# Patient Record
Sex: Male | Born: 1965 | Race: White | Hispanic: No | Marital: Married | Smoking: Never smoker
Health system: Southern US, Community
[De-identification: ages and names within clinical notes are randomized; demographics above are authoritative.]

---

## 1988-07-19 HISTORY — PX: REPAIR / SUTURE TESTICULAR INJURY: SUR1151

## 1998-07-19 HISTORY — PX: NASAL SEPTUM SURGERY: SHX37

## 2007-11-06 ENCOUNTER — Encounter: Admission: RE | Admit: 2007-11-06 | Discharge: 2007-11-06 | Payer: Self-pay | Admitting: Otolaryngology

## 2009-11-29 IMAGING — CT CT PARANASAL SINUSES LIMITED
1 series · 16 of 30 positions shown, 20 images · non-contrast
Comparison: None

CLINICAL DATA: Chronic sinusitis and pressure.

CT PARANASAL SINUSES WITHOUT CONTRAST
TECHNIQUE: Multidetector CT through the paranasal sinuses was
performed using the standard protocol without intravenous contrast.

[Series 2: limited sinus prone · axial · 0.33mm/px · z∈[-12,+99]mm · 16 of 32 slices shown, 20 images]
[im 2/32  brain]
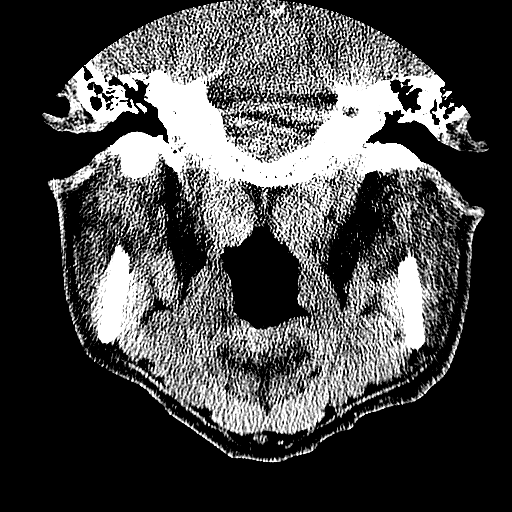
[im 2/32  bone]
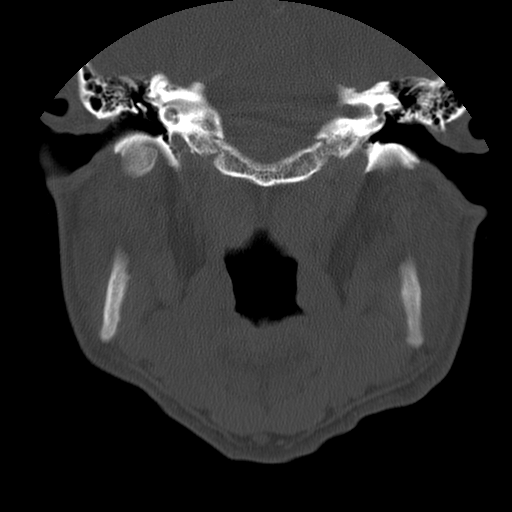
[im 4/32  bone]
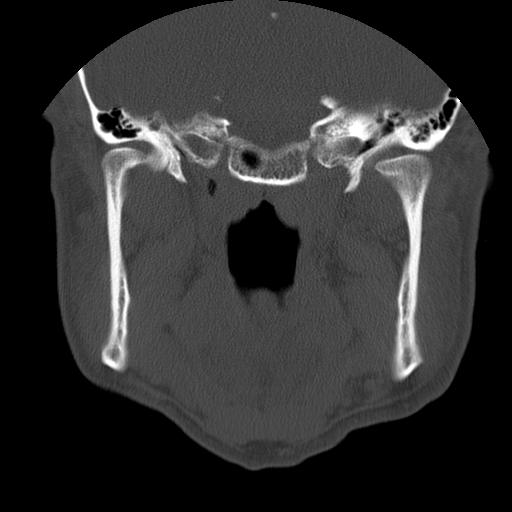
[im 6/32  bone]
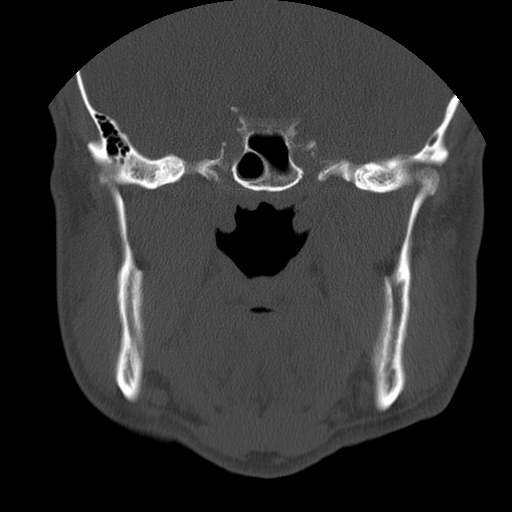
[im 8/32  bone]
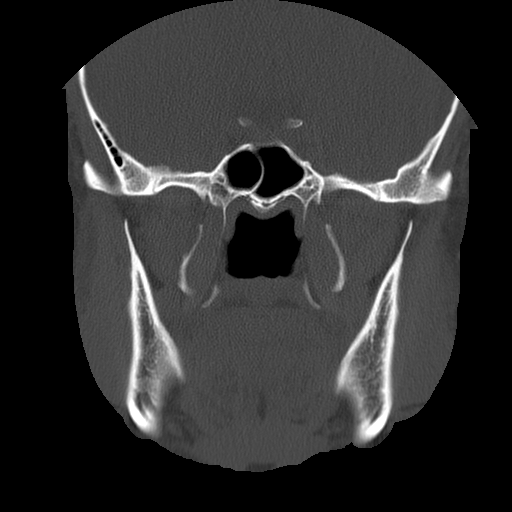
[im 9/32  brain]
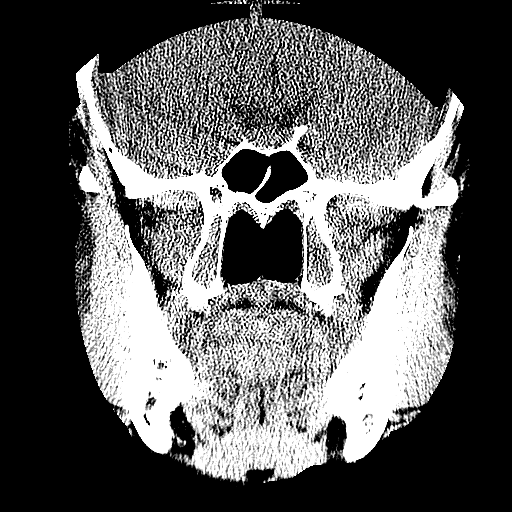
[im 9/32  bone]
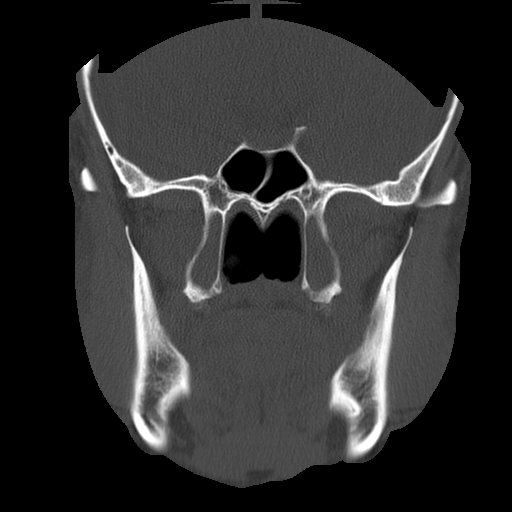
[im 11/32  bone]
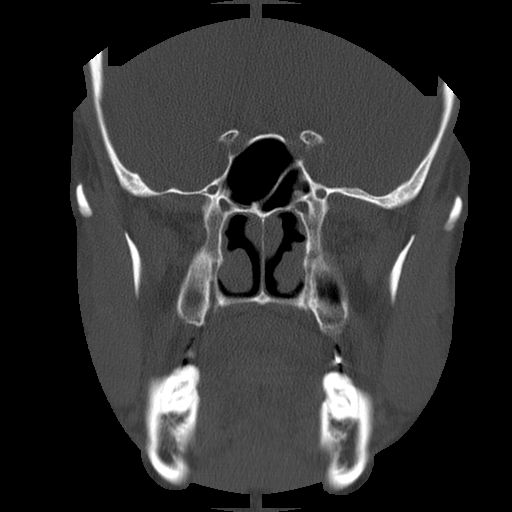
[im 13/32  bone]
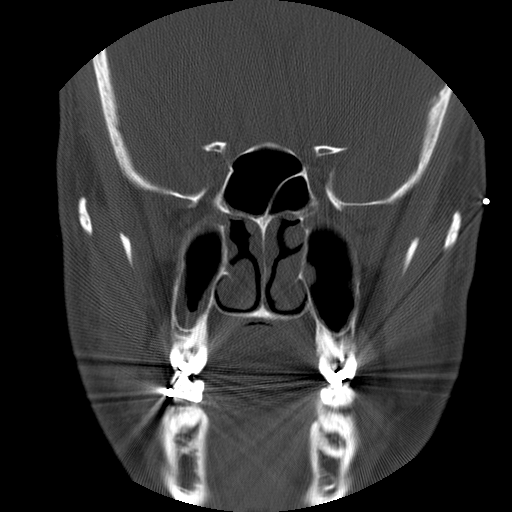
[im 15/32  bone]
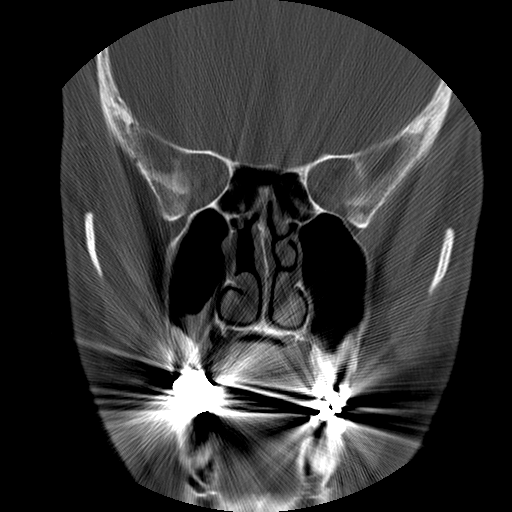
[im 17/32  brain]
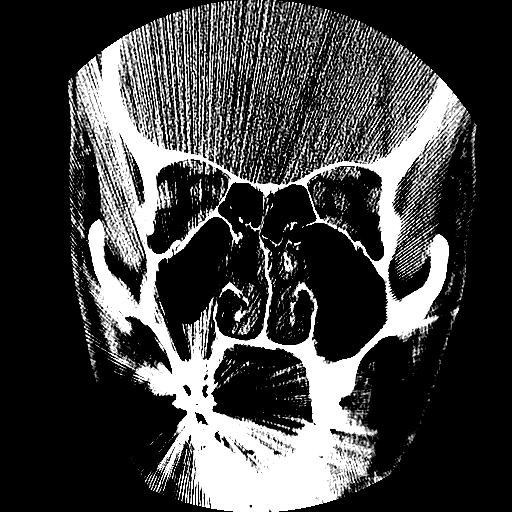
[im 17/32  bone]
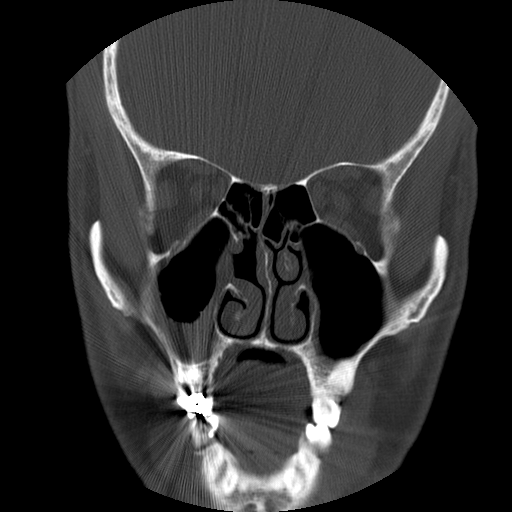
[im 19/32  bone]
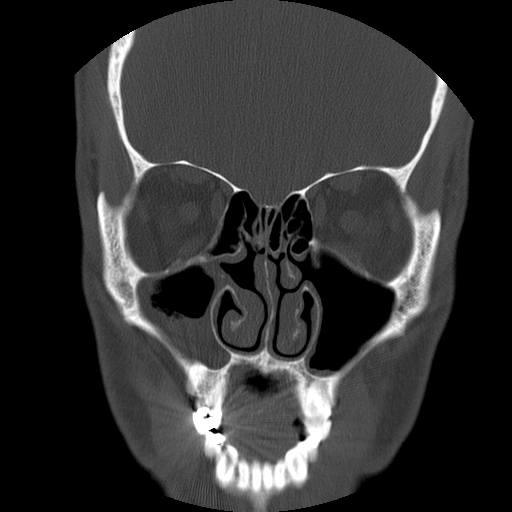
[im 21/32  bone]
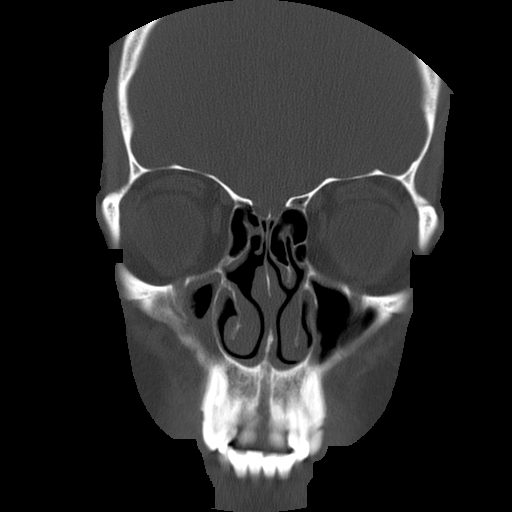
[im 23/32  bone]
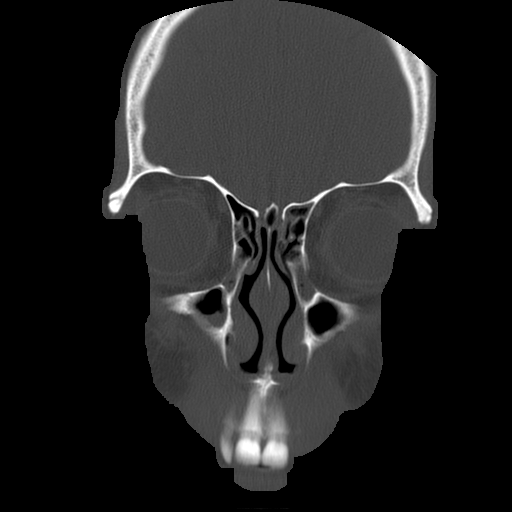
[im 24/32  brain]
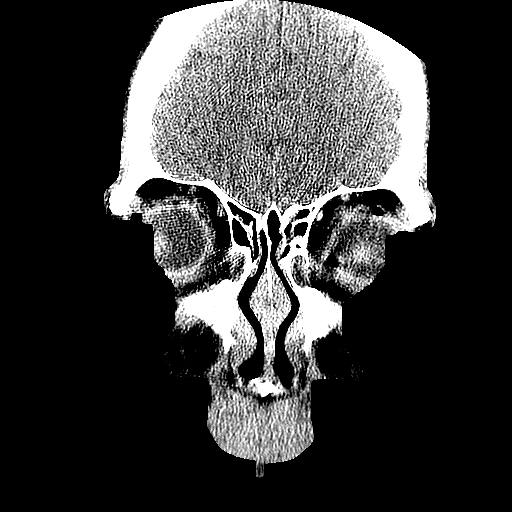
[im 24/32  bone]
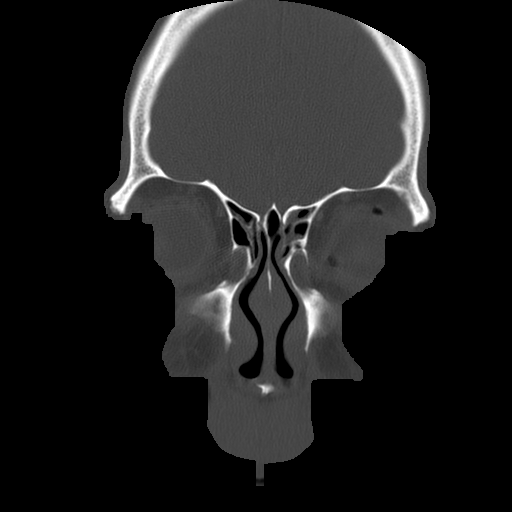
[im 26/32  bone]
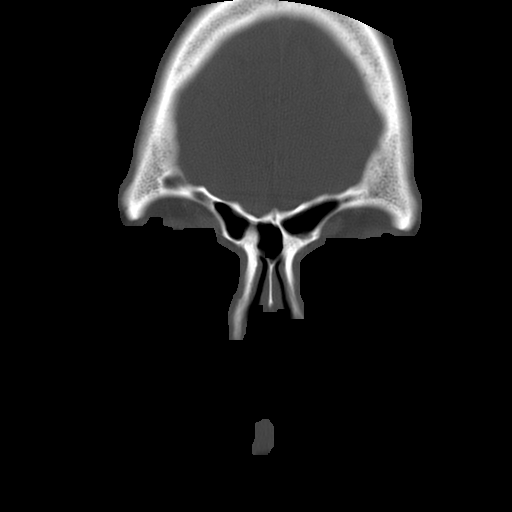
[im 28/32  bone]
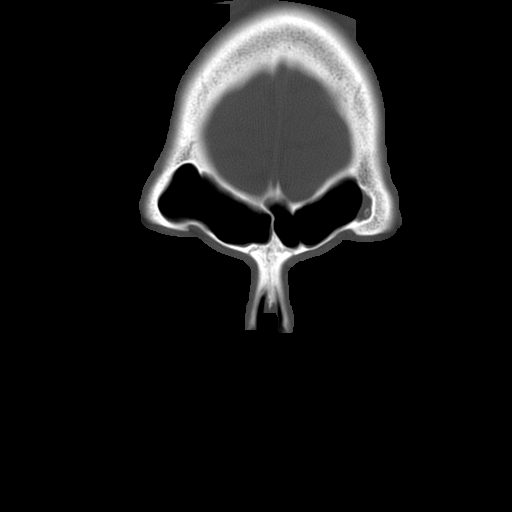
[im 30/32  bone]
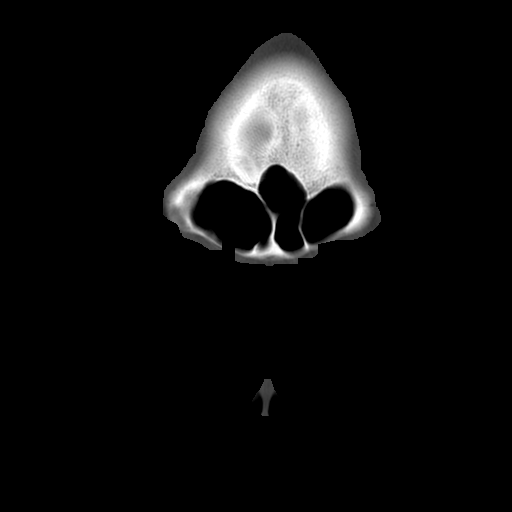

[16 of 30 positions shown; findings below may reference images not displayed]

FINDINGS: Mucosal thickening is seen in all paranasal sinuses and
is worst in the left maxillary sinus.  Retention cyst or polyp is
seen in the right sphenoid sinus.  Mild rightward nasal septum
deviation.  No definite air-fluid levels.  Visualized mastoid air
cells are clear.  Visualized intracranial contents show no acute
findings.
IMPRESSION: 1.  Paranasal sinus inflammation, as above.

## 2022-04-20 ENCOUNTER — Ambulatory Visit: Payer: Self-pay | Admitting: Surgery

## 2022-04-20 DIAGNOSIS — K439 Ventral hernia without obstruction or gangrene: Secondary | ICD-10-CM | POA: Diagnosis present

## 2022-04-20 DIAGNOSIS — E669 Obesity, unspecified: Secondary | ICD-10-CM | POA: Insufficient documentation

## 2022-05-03 NOTE — Patient Instructions (Addendum)
SURGICAL WAITING ROOM VISITATION Patients having surgery or a procedure may have no more than 2 support people in the waiting area - these visitors may rotate in the visitor waiting room.   Children under the age of 71 must have an adult with them who is not the patient. If the patient needs to stay at the hospital during part of their recovery, the visitor guidelines for inpatient rooms apply.  PRE-OP VISITATION  Pre-op nurse will coordinate an appropriate time for 1 support person to accompany the patient in pre-op.  This support person may not rotate.  This visitor will be contacted when the time is appropriate for the visitor to come back in the pre-op area.  Please refer to the [facility] website for the visitor guidelines for Inpatients (after your surgery is over and you are in a regular room).  You are not required to quarantine at this time prior to your surgery. However, you must do this: Hand Hygiene often Do NOT share personal items Notify your provider if you are in close contact with someone who has COVID or you develop fever 100.4 or greater, new onset of sneezing, cough, sore throat, shortness of breath or body aches.   If you received a COVID test during your pre-op visit  it is requested that you wear a mask when out in public, stay away from anyone that may not be feeling well and notify your surgeon if you develop symptoms. If you test positive for Covid or have been in contact with anyone that has tested positive in the last 10 days please notify you surgeon.       Your procedure is scheduled on:  THursday  May 13, 2022  Report to [facility] Main Entrance.  Report to admitting at: 05:15    AM  +++++Call this number if you have any questions or problems the morning of surgery [phone]  Do not eat food :After Midnight the night prior to your surgery/procedure.  After Midnight you may have the following liquids until  04:30 AM DAY OF SURGERY  Clear  Liquid Diet Water Black Coffee (sugar ok, NO MILK/CREAM OR CREAMERS)  Tea (sugar ok, NO MILK/CREAM OR CREAMERS) regular and decaf                             Plain Jell-O  with no fruit (NO RED)                                           Fruit ices (not with fruit pulp, NO RED)                                     Popsicles (NO RED)                                                                  Juice: apple, WHITE grape, WHITE cranberry Sports drinks like Gatorade or Powerade (NO RED)         The evening prior to your surgery,  Drink two (2)  bottles of the Ensure Pre-Surgery Clear      The day of surgery:  Drink ONE (1) Pre-Surgery Clear Ensure at 04:30  AM the morning of surgery. Drink in one sitting. Do not sip.  This drink was given to you during your hospital pre-op appointment visit. Nothing else to drink after completing the Pre-Surgery Clear Ensure,  No candy, chewing gum or throat lozenges.    FOLLOW BOWEL PREP AND ANY ADDITIONAL PRE OP INSTRUCTIONS YOU RECEIVED FROM YOUR SURGEON'S OFFICE!!!   Oral Hygiene is also important to reduce your risk of infection.        Remember - BRUSH YOUR TEETH THE MORNING OF SURGERY WITH YOUR REGULAR TOOTHPASTE   Take ONLY these medicines the morning of surgery with A SIP OF WATER: No Medications   You may not have any metal on your body including  jewelry, and body piercing  Do not wear  lotions, powders,  cologne, or deodorant  Men may shave face and neck.  Contacts, Hearing Aids, dentures or bridgework may not be worn into surgery.   You may bring a small overnight bag with you on the day of surgery, only pack items that are not valuable .[facility] IS NOT RESPONSIBLE   FOR VALUABLES THAT ARE LOST OR STOLEN.   DO NOT BRING YOUR HOME MEDICATIONS TO THE HOSPITAL. PHARMACY WILL DISPENSE MEDICATIONS LISTED ON YOUR MEDICATION LIST TO YOU DURING YOUR ADMISSION IN THE HOSPITAL!   Patients discharged on the day of surgery will not be  allowed to drive home.  Someone NEEDS to stay with you for the first 24 hours after anesthesia.  Special Instructions: Bring a copy of your healthcare power of attorney and living will documents the day of surgery, if you wish to have them scanned into your [facility]- EPIC  Please read over the following fact sheets you were given: IF YOU HAVE QUESTIONS ABOUT YOUR PRE-OP INSTRUCTIONS, PLEASE CALL [phone]  (KAY)   [place] - Preparing for Surgery Before surgery, you can play an important role.  Because skin is not sterile, your skin needs to be as free of germs as possible.  You can reduce the number of germs on your skin by washing with CHG (chlorahexidine gluconate) soap before surgery.  CHG is an antiseptic cleaner which kills germs and bonds with the skin to continue killing germs even after washing. Please DO NOT use if you have an allergy to CHG or antibacterial soaps.  If your skin becomes reddened/irritated stop using the CHG and inform your nurse when you arrive at Short Stay. Do not shave (including legs and underarms) for at least 48 hours prior to the first CHG shower.  You may shave your face/neck.  Please follow these instructions carefully:  1.  Shower with CHG Soap the night before surgery and the  morning of surgery.  2.  If you choose to wash your hair, wash your hair first as usual with your normal  shampoo.  3.  After you shampoo, rinse your hair and body thoroughly to remove the shampoo.                             4.  Use CHG as you would any other liquid soap.  You can apply chg directly to the skin and wash.  Gently with a scrungie or clean washcloth.  5.  Apply the CHG Soap to your  body ONLY FROM THE NECK DOWN.   Do not use on face/ open                           Wound or open sores. Avoid contact with eyes, ears mouth and genitals (private parts).                       Wash face,  Genitals (private parts) with your normal soap.             6.   Wash thoroughly, paying special attention to the area where your  surgery  will be performed.  7.  Thoroughly rinse your body with warm water from the neck down.  8.  DO NOT shower/wash with your normal soap after using and rinsing off the CHG Soap.            9.  Pat yourself dry with a clean towel.            10.  Wear clean pajamas.            11.  Place clean sheets on your bed the night of your first shower and do not  sleep with pets.  ON THE DAY OF SURGERY : Do not apply any lotions/deodorants the morning of surgery.  Please wear clean clothes to the hospital/surgery center.    FAILURE TO FOLLOW THESE INSTRUCTIONS MAY RESULT IN THE CANCELLATION OF YOUR SURGERY  PATIENT SIGNATURE_________________________________  NURSE SIGNATURE__________________________________  ________________________________________________________________________

## 2022-05-03 NOTE — Progress Notes (Signed)
COVID Vaccine received:  [x]  No []  Yes Date of any COVID positive Test in last 90 days: None  PCP - Cristal Ford, MD   [address], [place] [address]   [phone] (Work)   [phone] (Fax)    Cardiologist - none  Chest x-ray - n/a EKG -  n/a Stress Test -n/a ECHO - n/a Cardiac Cath - n/a  Pacemaker/ICD device     [x]  N/A Spinal Cord Stimulator:[x]  No []  Yes      (Remind patient to bring remote DOS) Other Implants:   Bowel Prep - 3 Ensures (no Bowel Prep ordered)  History of Sleep Apnea? [x]  No []  Yes   Sleep Study Date:   CPAP used?- [x]  No []  Yes  (Instruct to bring their mask & Tubing)  Does the patient monitor blood sugar? []  No []  Yes  [x]  N/A  Blood Thinner Instructions:  None Aspirin Instructions: None Last Dose:  ERAS Protocol Ordered: []  No  [x]  Yes PRE-SURGERY [x]  ENSURE  x3  Comments:   Activity level: Patient can climb a flight of stairs without difficulty; [x]  No CP  [x]  No SOB,   Anesthesia review: No pertinent medical hx, Patient is on no medications prior to surgery.  Patient denies shortness of breath, fever, cough and chest pain at PAT appointment.  Patient verbalized understanding and agreement to the Pre-Surgical Instructions that were given to them at this PAT appointment. Patient was also educated of the need to review these PAT instructions again prior to his/her surgery.I reviewed the appropriate phone numbers to call if they have any and questions or concerns.

## 2022-05-05 ENCOUNTER — Other Ambulatory Visit: Payer: Self-pay

## 2022-05-05 ENCOUNTER — Encounter (HOSPITAL_COMMUNITY)
Admission: RE | Admit: 2022-05-05 | Discharge: 2022-05-05 | Disposition: A | Discharge status: Home / Self Care | Payer: Self-pay | Source: Ambulatory Visit | Attending: Surgery | Admitting: Surgery

## 2022-05-05 ENCOUNTER — Encounter (HOSPITAL_COMMUNITY): Payer: Self-pay

## 2022-05-05 VITALS — BP 150/90 | HR 78 | Temp 98.1°F | Resp 14 | Ht 75.0 in | Wt 254.0 lb

## 2022-05-05 DIAGNOSIS — Z01818 Encounter for other preprocedural examination: Secondary | ICD-10-CM | POA: Insufficient documentation

## 2022-05-05 LAB — CBC
HCT: 43.8 % (ref 39.0–52.0)
Hemoglobin: 14.8 g/dL (ref 13.0–17.0)
MCH: 29.3 pg (ref 26.0–34.0)
MCHC: 33.8 g/dL (ref 30.0–36.0)
MCV: 86.7 fL (ref 80.0–100.0)
Platelets: 224 10*3/uL (ref 150–400)
RBC: 5.05 MIL/uL (ref 4.22–5.81)
RDW: 13.3 % (ref 11.5–15.5)
WBC: 7.9 10*3/uL (ref 4.0–10.5)
nRBC: 0 % (ref 0.0–0.2)

## 2022-05-12 NOTE — Anesthesia Preprocedure Evaluation (Addendum)
Anesthesia Evaluation  Patient identified by MRN, date of birth, ID band Patient awake    Reviewed: Allergy & Precautions, NPO status , Patient's Chart, lab work & pertinent test results  History of Anesthesia Complications Negative for: history of anesthetic complications  Airway Mallampati: II  TM Distance: >3 FB Neck ROM: Full    Dental no notable dental hx.    Pulmonary neg pulmonary ROS,    Pulmonary exam normal        Cardiovascular negative cardio ROS Normal cardiovascular exam     Neuro/Psych negative neurological ROS  negative psych ROS   GI/Hepatic Neg liver ROS, PERIUMBILICAL ABDOMINAL WALL HERNIAE   Endo/Other  BMI 32  Renal/GU negative Renal ROS  negative genitourinary   Musculoskeletal negative musculoskeletal ROS (+)   Abdominal   Peds  Hematology negative hematology ROS (+)   Anesthesia Other Findings Day of surgery medications reviewed with patient.  Reproductive/Obstetrics negative OB ROS                           Anesthesia Physical Anesthesia Plan  ASA: 2  Anesthesia Plan: General   Post-op Pain Management: Tylenol PO (pre-op)*, Celebrex PO (pre-op)*, Gabapentin PO (pre-op)* and Ketamine IV*   Induction: Intravenous  PONV Risk Score and Plan: 3 and Treatment may vary due to age or medical condition, Ondansetron, Dexamethasone and Midazolam  Airway Management Planned: Oral ETT  Additional Equipment: None  Intra-op Plan:   Post-operative Plan: Extubation in OR  Informed Consent: I have reviewed the patients History and Physical, chart, labs and discussed the procedure including the risks, benefits and alternatives for the proposed anesthesia with the patient or authorized representative who has indicated his/her understanding and acceptance.     Dental advisory given  Plan Discussed with: CRNA  Anesthesia Plan Comments:       Anesthesia Quick  Evaluation

## 2022-05-13 ENCOUNTER — Encounter (HOSPITAL_COMMUNITY)
Admission: RE | Disposition: A | Discharge status: Home / Self Care | Payer: Self-pay | Source: Ambulatory Visit | Attending: Surgery

## 2022-05-13 ENCOUNTER — Ambulatory Visit (HOSPITAL_COMMUNITY): Payer: Self-pay | Admitting: Anesthesiology

## 2022-05-13 ENCOUNTER — Other Ambulatory Visit: Payer: Self-pay

## 2022-05-13 ENCOUNTER — Ambulatory Visit (HOSPITAL_BASED_OUTPATIENT_CLINIC_OR_DEPARTMENT_OTHER): Payer: Self-pay | Admitting: Anesthesiology

## 2022-05-13 ENCOUNTER — Encounter (HOSPITAL_COMMUNITY): Payer: Self-pay | Admitting: Surgery

## 2022-05-13 ENCOUNTER — Ambulatory Visit (HOSPITAL_COMMUNITY)
Admission: RE | Admit: 2022-05-13 | Discharge: 2022-05-13 | Disposition: A | Discharge status: Home / Self Care | Payer: Self-pay | Source: Ambulatory Visit | Attending: Surgery | Admitting: Surgery

## 2022-05-13 DIAGNOSIS — K439 Ventral hernia without obstruction or gangrene: Secondary | ICD-10-CM | POA: Insufficient documentation

## 2022-05-13 DIAGNOSIS — Z6832 Body mass index (BMI) 32.0-32.9, adult: Secondary | ICD-10-CM | POA: Insufficient documentation

## 2022-05-13 DIAGNOSIS — E669 Obesity, unspecified: Secondary | ICD-10-CM | POA: Insufficient documentation

## 2022-05-13 DIAGNOSIS — Z9079 Acquired absence of other genital organ(s): Secondary | ICD-10-CM | POA: Insufficient documentation

## 2022-05-13 HISTORY — PX: VENTRAL HERNIA REPAIR: SHX424

## 2022-05-13 SURGERY — REPAIR, HERNIA, VENTRAL, LAPAROSCOPIC
Anesthesia: General | Site: Abdomen

## 2022-05-13 MED ORDER — BUPIVACAINE LIPOSOME 1.3 % IJ SUSP: 20.0000 mL | Freq: Once | INTRAMUSCULAR | Status: DC

## 2022-05-13 MED ORDER — ROCURONIUM BROMIDE 10 MG/ML (PF) SYRINGE
PREFILLED_SYRINGE | INTRAVENOUS | Status: AC
  Filled 2022-05-13: qty 10

## 2022-05-13 MED ORDER — LIDOCAINE HCL 1 % IJ SOLN
INTRAMUSCULAR | Status: AC
  Filled 2022-05-13: qty 20

## 2022-05-13 MED ORDER — DEXAMETHASONE SODIUM PHOSPHATE 10 MG/ML IJ SOLN
INTRAMUSCULAR | Status: DC | PRN
  Administered 2022-05-13: 10 mg via INTRAVENOUS

## 2022-05-13 MED ORDER — CEFAZOLIN SODIUM-DEXTROSE 2-4 GM/100ML-% IV SOLN
2.0000 g | INTRAVENOUS | Status: AC
  Administered 2022-05-13: 2 g via INTRAVENOUS
  Filled 2022-05-13: qty 100

## 2022-05-13 MED ORDER — FENTANYL CITRATE (PF) 100 MCG/2ML IJ SOLN
INTRAMUSCULAR | Status: AC
  Filled 2022-05-13: qty 2

## 2022-05-13 MED ORDER — ROCURONIUM BROMIDE 100 MG/10ML IV SOLN
INTRAVENOUS | Status: DC | PRN
  Administered 2022-05-13: 70 mg via INTRAVENOUS
  Administered 2022-05-13: 30 mg via INTRAVENOUS
  Administered 2022-05-13: 15 mg via INTRAVENOUS

## 2022-05-13 MED ORDER — CHLORHEXIDINE GLUCONATE 0.12 % MT SOLN
15.0000 mL | Freq: Once | OROMUCOSAL | Status: AC
  Administered 2022-05-13: 15 mL via OROMUCOSAL

## 2022-05-13 MED ORDER — LIDOCAINE HCL (PF) 2 % IJ SOLN
INTRAMUSCULAR | Status: DC | PRN
  Administered 2022-05-13: 1.5 mg/kg/h via INTRADERMAL

## 2022-05-13 MED ORDER — GABAPENTIN 300 MG PO CAPS
300.0000 mg | ORAL_CAPSULE | ORAL | Status: AC
  Administered 2022-05-13: 300 mg via ORAL
  Filled 2022-05-13: qty 1

## 2022-05-13 MED ORDER — BUPIVACAINE-EPINEPHRINE (PF) 0.25% -1:200000 IJ SOLN
INTRAMUSCULAR | Status: AC
  Filled 2022-05-13: qty 60

## 2022-05-13 MED ORDER — LIDOCAINE HCL (PF) 2 % IJ SOLN
INTRAMUSCULAR | Status: AC
  Filled 2022-05-13: qty 5

## 2022-05-13 MED ORDER — FENTANYL CITRATE (PF) 250 MCG/5ML IJ SOLN
INTRAMUSCULAR | Status: AC
  Filled 2022-05-13: qty 5

## 2022-05-13 MED ORDER — OXYCODONE HCL 5 MG PO TABS: 5.0000 mg | ORAL_TABLET | Freq: Once | ORAL | Status: DC | PRN

## 2022-05-13 MED ORDER — CELECOXIB 200 MG PO CAPS
200.0000 mg | ORAL_CAPSULE | ORAL | Status: AC
  Administered 2022-05-13: 200 mg via ORAL
  Filled 2022-05-13: qty 1

## 2022-05-13 MED ORDER — MIDAZOLAM HCL 2 MG/2ML IJ SOLN
INTRAMUSCULAR | Status: AC
  Filled 2022-05-13: qty 2

## 2022-05-13 MED ORDER — KETAMINE HCL 10 MG/ML IJ SOLN
INTRAMUSCULAR | Status: DC | PRN
  Administered 2022-05-13: 10 mg via INTRAVENOUS
  Administered 2022-05-13: 40 mg via INTRAVENOUS

## 2022-05-13 MED ORDER — BUPIVACAINE LIPOSOME 1.3 % IJ SUSP
INTRAMUSCULAR | Status: AC
  Filled 2022-05-13: qty 20

## 2022-05-13 MED ORDER — PROPOFOL 10 MG/ML IV BOLUS
INTRAVENOUS | Status: AC
  Filled 2022-05-13: qty 20

## 2022-05-13 MED ORDER — ONDANSETRON HCL 4 MG/2ML IJ SOLN
INTRAMUSCULAR | Status: DC | PRN
  Administered 2022-05-13: 4 mg via INTRAVENOUS

## 2022-05-13 MED ORDER — FENTANYL CITRATE (PF) 100 MCG/2ML IJ SOLN
INTRAMUSCULAR | Status: DC | PRN
  Administered 2022-05-13 (×5): 50 ug via INTRAVENOUS

## 2022-05-13 MED ORDER — ORAL CARE MOUTH RINSE: 15.0000 mL | Freq: Once | OROMUCOSAL | Status: AC

## 2022-05-13 MED ORDER — ONDANSETRON 4 MG PO TBDP
ORAL_TABLET | ORAL | Status: AC
  Filled 2022-05-13: qty 1

## 2022-05-13 MED ORDER — AMISULPRIDE (ANTIEMETIC) 5 MG/2ML IV SOLN: 10.0000 mg | Freq: Once | INTRAVENOUS | Status: DC | PRN

## 2022-05-13 MED ORDER — LACTATED RINGERS IV SOLN: INTRAVENOUS | Status: DC

## 2022-05-13 MED ORDER — SUGAMMADEX SODIUM 200 MG/2ML IV SOLN
INTRAVENOUS | Status: DC | PRN
  Administered 2022-05-13: 200 mg via INTRAVENOUS

## 2022-05-13 MED ORDER — ACETAMINOPHEN 500 MG PO TABS: 1000.0000 mg | ORAL_TABLET | Freq: Once | ORAL | Status: DC

## 2022-05-13 MED ORDER — CHLORHEXIDINE GLUCONATE CLOTH 2 % EX PADS: 6.0000 | MEDICATED_PAD | Freq: Once | CUTANEOUS | Status: DC

## 2022-05-13 MED ORDER — 0.9 % SODIUM CHLORIDE (POUR BTL) OPTIME
TOPICAL | Status: DC | PRN
  Administered 2022-05-13: 1000 mL

## 2022-05-13 MED ORDER — ONDANSETRON HCL 4 MG/2ML IJ SOLN
INTRAMUSCULAR | Status: AC
  Filled 2022-05-13: qty 2

## 2022-05-13 MED ORDER — MIDAZOLAM HCL 5 MG/5ML IJ SOLN
INTRAMUSCULAR | Status: DC | PRN
  Administered 2022-05-13 (×2): 1 mg via INTRAVENOUS

## 2022-05-13 MED ORDER — ENSURE PRE-SURGERY PO LIQD: 296.0000 mL | Freq: Once | ORAL | Status: DC

## 2022-05-13 MED ORDER — PROPOFOL 10 MG/ML IV BOLUS
INTRAVENOUS | Status: DC | PRN
  Administered 2022-05-13: 200 mg via INTRAVENOUS

## 2022-05-13 MED ORDER — BUPIVACAINE-EPINEPHRINE 0.25% -1:200000 IJ SOLN
INTRAMUSCULAR | Status: DC | PRN
  Administered 2022-05-13: 60 mL

## 2022-05-13 MED ORDER — OXYCODONE HCL 5 MG/5ML PO SOLN: 5.0000 mg | Freq: Once | ORAL | Status: DC | PRN

## 2022-05-13 MED ORDER — STERILE WATER FOR IRRIGATION IR SOLN
Status: DC | PRN
  Administered 2022-05-13: 1000 mL

## 2022-05-13 MED ORDER — ONDANSETRON 4 MG PO TBDP
4.0000 mg | ORAL_TABLET | Freq: Once | ORAL | Status: AC
  Administered 2022-05-13: 4 mg via ORAL

## 2022-05-13 MED ORDER — KETAMINE HCL 50 MG/5ML IJ SOSY
PREFILLED_SYRINGE | INTRAMUSCULAR | Status: AC
  Filled 2022-05-13: qty 5

## 2022-05-13 MED ORDER — TRAMADOL HCL 50 MG PO TABS: 50.0000 mg | ORAL_TABLET | Freq: Four times a day (QID) | ORAL | 0 refills | Status: AC | PRN

## 2022-05-13 MED ORDER — ENSURE PRE-SURGERY PO LIQD: 592.0000 mL | Freq: Once | ORAL | Status: DC

## 2022-05-13 MED ORDER — FENTANYL CITRATE PF 50 MCG/ML IJ SOSY: 25.0000 ug | PREFILLED_SYRINGE | INTRAMUSCULAR | Status: DC | PRN

## 2022-05-13 MED ORDER — DEXAMETHASONE SODIUM PHOSPHATE 10 MG/ML IJ SOLN
INTRAMUSCULAR | Status: AC
  Filled 2022-05-13: qty 1

## 2022-05-13 MED ORDER — LIDOCAINE HCL (CARDIAC) PF 100 MG/5ML IV SOSY
PREFILLED_SYRINGE | INTRAVENOUS | Status: DC | PRN
  Administered 2022-05-13: 100 mg via INTRAVENOUS

## 2022-05-13 MED ORDER — BUPIVACAINE LIPOSOME 1.3 % IJ SUSP
INTRAMUSCULAR | Status: DC | PRN
  Administered 2022-05-13: 20 mL

## 2022-05-13 MED ORDER — ACETAMINOPHEN 500 MG PO TABS
1000.0000 mg | ORAL_TABLET | ORAL | Status: AC
  Administered 2022-05-13: 1000 mg via ORAL
  Filled 2022-05-13: qty 2

## 2022-05-13 SURGICAL SUPPLY — 41 items
APPLIER CLIP 5 13 M/L LIGAMAX5 (MISCELLANEOUS)
BAG COUNTER SPONGE SURGICOUNT (BAG) ×1 IMPLANT
BINDER ABDOMINAL 12 ML 46-62 (SOFTGOODS) IMPLANT
CABLE HIGH FREQUENCY MONO STRZ (ELECTRODE) ×1 IMPLANT
CHLORAPREP W/TINT 26 (MISCELLANEOUS) ×1 IMPLANT
CLIP APPLIE 5 13 M/L LIGAMAX5 (MISCELLANEOUS) IMPLANT
COVER SURGICAL LIGHT HANDLE (MISCELLANEOUS) ×1 IMPLANT
DEVICE SECURE STRAP 25 ABSORB (INSTRUMENTS) IMPLANT
DEVICE TROCAR PUNCTURE CLOSURE (ENDOMECHANICALS) ×1 IMPLANT
DRAPE WARM FLUID 44X44 (DRAPES) ×1 IMPLANT
DRSG TEGADERM 2-3/8X2-3/4 SM (GAUZE/BANDAGES/DRESSINGS) ×3 IMPLANT
DRSG TEGADERM 4X4.75 (GAUZE/BANDAGES/DRESSINGS) ×1 IMPLANT
ELECT REM PT RETURN 15FT ADLT (MISCELLANEOUS) ×1 IMPLANT
GAUZE SPONGE 2X2 8PLY STRL LF (GAUZE/BANDAGES/DRESSINGS) IMPLANT
GLOVE ECLIPSE 8.0 STRL XLNG CF (GLOVE) ×1 IMPLANT
GLOVE INDICATOR 8.0 STRL GRN (GLOVE) ×1 IMPLANT
GOWN STRL REUS W/ TWL XL LVL3 (GOWN DISPOSABLE) ×3 IMPLANT
GOWN STRL REUS W/TWL XL LVL3 (GOWN DISPOSABLE) ×3
IRRIG SUCT STRYKERFLOW 2 WTIP (MISCELLANEOUS)
IRRIGATION SUCT STRKRFLW 2 WTP (MISCELLANEOUS) IMPLANT
KIT BASIN OR (CUSTOM PROCEDURE TRAY) ×1 IMPLANT
KIT TURNOVER KIT A (KITS) IMPLANT
MARKER SKIN DUAL TIP RULER LAB (MISCELLANEOUS) ×1 IMPLANT
MESH VENTRALIGHT ST 4X6IN (Mesh General) IMPLANT
NDL SPNL 22GX3.5 QUINCKE BK (NEEDLE) IMPLANT
NEEDLE SPNL 22GX3.5 QUINCKE BK (NEEDLE) IMPLANT
PAD POSITIONING PINK XL (MISCELLANEOUS) ×1 IMPLANT
PENCIL SMOKE EVACUATOR (MISCELLANEOUS) IMPLANT
SCISSORS LAP 5X35 DISP (ENDOMECHANICALS) ×1 IMPLANT
SET TUBE SMOKE EVAC HIGH FLOW (TUBING) ×1 IMPLANT
SLEEVE ADV FIXATION 5X100MM (TROCAR) ×1 IMPLANT
SPIKE FLUID TRANSFER (MISCELLANEOUS) ×1 IMPLANT
STRIP CLOSURE SKIN 1/2X4 (GAUZE/BANDAGES/DRESSINGS) ×2 IMPLANT
SUT MNCRL AB 4-0 PS2 18 (SUTURE) ×1 IMPLANT
SUT PDS AB 1 CT1 27 (SUTURE) ×2 IMPLANT
SUT PROLENE 1 CT 1 30 (SUTURE) ×5 IMPLANT
TOWEL OR 17X26 10 PK STRL BLUE (TOWEL DISPOSABLE) ×1 IMPLANT
TRAY LAPAROSCOPIC (CUSTOM PROCEDURE TRAY) ×1 IMPLANT
TROCAR 11X100 Z THREAD (TROCAR) IMPLANT
TROCAR ADV FIXATION 5X100MM (TROCAR) ×1 IMPLANT
TROCAR Z-THREAD OPTICAL 5X100M (TROCAR) ×1 IMPLANT

## 2022-05-13 NOTE — Op Note (Signed)
05/13/2022  PATIENT:  Edgar Gallegos  56 y.o. male  Patient Care Team: Patient, No Pcp Per as PCP - General (General Practice) Serena Colonel, MD as Consulting Physician (Otolaryngology) Karie Soda, MD as Consulting Physician (General Surgery)  PRE-OPERATIVE DIAGNOSIS:  PERIUMBILICAL ABDOMINAL WALL HERNIAE  POST-OPERATIVE DIAGNOSIS:  PERIUMBILICAL ABDOMINAL WALL HERNIAE  PROCEDURE:   LAPAROSCOPIC REPAIR OF ABDOMINAL HERNIA WITH MESH  (See OR Findings below) TAP BLOCK - BILATERAL  SURGEON:  Ardeth Sportsman, MD  ASSISTANT: Jenna Luo, MD,  PGY-5, [employer]    ANESTHESIA:     General  Regional TRANSVERSUS ABDOMINIS PLANE (TAP) nerve block for perioperative & postoperative pain control provided with liposomal bupivacaine (Experel) mixed with 0.25% bupivacaine as a Bilateral TAP block x 68mL each side at the level of the transverse abdominis & preperitoneal spaces along the flank at the anterior axillary line, from subcostal ridge to iliac crest under laparoscopic guidance   EBL:  Total I/O In: 1100 [I.V.:1000; IV Piggyback:100] Out: 40 [Blood:40]  Per anesthesia record  Delay start of Pharmacological VTE agent (>24hrs) due to surgical blood loss or risk of bleeding:  no  DRAINS: none   SPECIMEN:  No Specimen  DISPOSITION OF SPECIMEN:  N/A  COUNTS:  YES  PLAN OF CARE: Discharge to home after PACU  PATIENT DISPOSITION:  PACU - hemodynamically stable.  INDICATION: Pleasant patient has developed a ventral wall abdominal hernia. Recommendation was made for surgical repair  The anatomy & physiology of the abdominal wall was discussed. The pathophysiology of hernias was discussed. Natural history risks without surgery including progeressive enlargement, pain, incarceration & strangulation was discussed. Contributors to complications such as smoking, obesity, diabetes, prior surgery, etc were discussed.  I feel the risks of no intervention will lead to serious problems that  outweigh the operative risks; therefore, I recommended surgery to reduce and repair the hernia. I explained laparoscopic techniques with possible need for an open approach. I noted the probable use of mesh to patch and/or buttress the hernia repair.  Risks such as bleeding, infection, abscess, need for further treatment, heart attack, death, and other risks were discussed. I noted a good likelihood this will help address the problem. Goals of post-operative recovery were discussed as well. Possibility that this will not correct all symptoms was explained. I stressed the importance of low-impact activity, aggressive pain control, avoiding constipation, & not pushing through pain to minimize risk of post-operative chronic pain or injury. Possibility of reherniation especially with smoking, obesity, diabetes, immunosuppression, and other health conditions was discussed. We will work to minimize complications.   An educational handout further explaining the pathology & treatment options was given as well. Questions were answered. The patient expresses understanding & wishes to proceed with surgery.    OR FINDINGS: Periumbilical ventral hernia in the setting of moderate diastases recti.    Hernia location: Periumbilical  Hernia type: Ventral (non-incisional) - Reducible  Hernia size - largest dimension 3.5cm x 3cm  Type of repair: Laparoscopic underlay repair.  Primary repair of largest hernia   Placement of mesh: Centrally intraperitoneal with edges tucked into RECTRORECTUS & preperitoneal space  Name of mesh: Bard Ventralight dual sided (polypropylene / Seprafilm)  Size of mesh: 15x10cm  Orientation: Transverse  Mesh overlap:  5cm   DESCRIPTION:   Informed consent was confirmed. The patient underwent general anaesthesia without difficulty. The patient was positioned appropriately. VTE prevention in place. The patient's abdomen was clipped, prepped, & draped in a sterile fashion. Surgical  timeout  confirmed our plan.  The patient was positioned in reverse Trendelenburg. Abdominal entry was gained using optical entry technique in the left upper abdomen. Entry was clean. I induced carbon dioxide insufflation. Camera inspection revealed no injury. Extra ports were carefully placed under direct laparoscopic visualization.   I could see the hernia on the parietal peritoneum under the abdominal wall.  We freed off the falciform ligament and central peritoneum to expose the retrorectus fascia to better expose the hernia and disprove any other hernias.  Moderate diastases recti.  Used focused scissors and cautery to good result.  We made sure hemostasis was good.  I mapped out the region using a needle passer.   To ensure that I would have at least 5 cm radial coverage outside of the hernia defect, I chose a 15x10cm dual sided mesh.  I placed #1 Prolene stitches around its edge about every 5 cm = 8 total.  I rolled the mesh & placed into the peritoneal cavity through the hernia defect.  I unrolled the mesh and positioned it appropriately.  I secured the mesh to cover up the hernia defect using a laparoscopic suture passer to pass the tails of the Prolene through the abdominal wall & tagged them with clamps for good transfascial suturing.  I started out in four corners to make sure I had the mesh centered under the hernia defect appropriately, and then proceeded to work in quadrants.    We evacuated CO2 & desufflated the abdomen.  I tied the fascial stitches down. I closed the fascial defect that I placed the mesh through using #1 PDS interrupted transverse stitches primarily.  I reinsufflated the abdomen. The mesh provided at least circumferential coverage around the entire region of hernia defects.  I secured the mesh centrally with an additional trans fascial stith in & out the mesh using #1 PDS under laparoscopic visualization.   I tacked the edges & central part of the mesh to the  peritoneum/posterior rectus fascia with SecureStrap absorbable tacks.   I did reinspection. Hemostasis was good. Mesh laid well. I completed a broad field block of local anesthesia at fascial stitch sites & fascial closure areas.    Capnoperitoneum was evacuated. Ports were removed. The skin was closed with Monocryl at the port sites and Steri-Strips on the fascial stitch puncture sites.  Patient is being extubated to go to the recovery room.  I discussed operative findings, updated the patient's status, discussed probable steps to recovery, and gave postoperative recommendations to the patient's spouse, Rhet Rorke.  Recommendations were made.  Questions were answered.  She expressed understanding & appreciation.  Adin Hector, M.D., F.A.C.S. Gastrointestinal and Minimally Invasive Surgery [facility] [address], [place], [place] [address] [phone] Main / Paging  05/13/2022 9:39 AM

## 2022-05-13 NOTE — H&P (Signed)
05/13/2022   REFERRING PHYSICIAN: Cristal Ford*  Patient Care Team: Cristal Ford, MD as PCP - General (Internal Medicine) Johney Maine, Adrian Saran, MD as Consulting Provider (General Surgery)  PROVIDER: Hollace Kinnier, MD  [facility] MRN: [id number] DOB: 1966/01/29  SUBJECTIVE   Chief Complaint: New Consultation (umbilical hernia)   History of Present Illness: Edgar Gallegos is a 56 y.o. male who is seen today  as an office consultation at the request of Dr. Jomarie Longs  for evaluation of New Consultation (umbilical hernia) .   Pleasant active male. Does some light moderate work but no heavy duty work. Noted hernia at bellybutton for many years -not particularly painful or sensitive but getting larger. Concern. Discussed with primary care. Surgical consultation offered. He has never been with our group before. Otherwise rather healthy. Had nasal septoplasty surgery up in [place] over 20 years ago. Followed by ENT Dr. Constance Holster over a decade ago. No tobacco nor diabetes. No sleep apnea. No prior abdominal surgeries. Have a fall traumatic injury requiring a right orchiectomy when he is around 80. No problems with urination or defecation. Otherwise rather physically active.  Ready for surgery    #################################  Medical History:  History reviewed. No pertinent past medical history.  Patient Active Problem List  Diagnosis  Obesity (BMI 30-39.9), unspecified  Ventral hernia without obstruction or gangrene   Past Surgical History:  Procedure Laterality Date  ORCHIECTOMY Right 1987  Traumatic injury    No Known Allergies  No current outpatient medications on file prior to visit.   No current facility-administered medications on file prior to visit.   History reviewed. No pertinent family history.   Social History   Tobacco Use  Smoking Status Never  Smokeless Tobacco Never    Social History   Socioeconomic History  Marital status: Single   Tobacco Use  Smoking status: Never  Smokeless tobacco: Never  Substance and Sexual Activity  Alcohol use: Not Currently  Drug use: Never   ############################################################  Review of Systems: A complete review of systems (ROS) was obtained from the patient. I have reviewed this information and discussed as appropriate with the patient. See HPI as well for other pertinent ROS.  Constitutional: No fevers, chills, sweats. Weight stable Eyes: No vision changes, No discharge HENT: No sore throats, nasal drainage Lymph: No neck swelling, No bruising easily Pulmonary: No cough, productive sputum CV: No orthopnea, PND . No exertional chest/neck/shoulder/arm pain. Patient can walk 2-3 miles without difficulty.   GI: No personal nor family history of GI/colon cancer, inflammatory bowel disease, irritable bowel syndrome, allergy such as Celiac Sprue, dietary/dairy problems, colitis, ulcers nor gastritis. No recent sick contacts/gastroenteritis. No travel outside the country. No changes in diet.  Renal: No UTIs, No hematuria Genital: No drainage, bleeding, masses Musculoskeletal: No severe joint pain. Good ROM major joints Skin: No sores or lesions Heme/Lymph: No easy bleeding. No swollen lymph nodes Neuro: No active seizures. No facial droop Psych: No hallucinations. No agitation  OBJECTIVE   Vitals:  04/20/22 0959  BP: (!) 140/90  Pulse: 104  Temp: 37 C (98.6 F)  SpO2: 99%  Weight: (!) 117 kg (258 lb)  Height: 190.5 cm (6\' 3" )   Body mass index is 32.25 kg/m.  PHYSICAL EXAM:  Constitutional: Not cachectic. Hygeine adequate. Vitals signs as above.  Eyes: No glasses. Vision adequate,Pupils reactive, normal extraocular movements. Sclera nonicteric Neuro: CN II-XII intact. No major focal sensory defects. No major motor deficits. Lymph: No head/neck/groin lymphadenopathy Psych: No severe  agitation. No severe anxiety. Judgment & insight Adequate,  Oriented x4, HENT: Normocephalic, Mucus membranes moist. No thrush. Hearing: adequate Neck: Supple, No tracheal deviation. No obvious thyromegaly Chest: No pain to chest wall compression. Good respiratory excursion. No audible wheezing CV: Pulses intact. regular. No major extremity edema Ext: No obvious deformity or contracture. Edema: Not present. No cyanosis Skin: No major subcutaneous nodules. Warm and dry Musculoskeletal: Severe joint rigidity not present. No obvious clubbing. No digital petechiae. Mobility: no assist device moving easily without restrictions  Abdomen: Obese Soft. Nondistended. Nontender. Hernia: Present at: periumbilical, size [id number]. Diastasis recti: Mild supraumbilical midline. No hepatomegaly. No splenomegaly.  Genital/Pelvic: Inguinal hernia: Not present. Right testicle absent. Inguinal lymph nodes: without lymphadenopathy nor hidradenitis.   Rectal: (Deferred)    ###################################################################  Labs, Imaging and Diagnostic Testing:  Located in 'Care Everywhere' section of Epic EMR chart  PRIOR [facility] CLINIC NOTES:  Not applicable  SURGERY NOTES:  Not applicable  PATHOLOGY:  Not applicable  Assessment and Plan:  DIAGNOSES:  Diagnoses and all orders for this visit:  Ventral hernia without obstruction or gangrene  Obesity (BMI 30-39.9), unspecified    ASSESSMENT/PLAN  Pleasant active male with periumbilical ventral hernia of moderate size but reducible.  I think he would benefit from repair since it is getting larger. We will plan laparoscopic exploration and underlay mesh given its size is obesity and physical requirements. Usually it be an outpatient surgery. No evidence of any hernias in his inguinal region or elsewhere. Paramedic caution he will need a least a couple weeks before thinking back to light duty work in 6 weeks before he is unrestricted. He notes most of his job is light duty anyway. He is  interested in proceeding. We will work to coordinate a convenient time  The anatomy & physiology of the abdominal wall was discussed. The pathophysiology of hernias was discussed. Natural history risks without surgery including progeressive enlargement, pain, incarceration, & strangulation was discussed. Contributors to complications such as smoking, obesity, diabetes, prior surgery, etc were discussed.   I feel the risks of no intervention will lead to serious problems that outweigh the operative risks; therefore, I recommended surgery to reduce and repair the hernia. I explained laparoscopic techniques with possible need for an open approach. I noted the probable use of mesh to patch and/or buttress the hernia repair  Risks such as bleeding, infection, abscess, need for further treatment, injury to other organs, need for repair of tissues / organs, stroke, heart attack, death, and other risks were discussed. I noted a good likelihood this will help address the problem. Goals of post-operative recovery were discussed as well. Possibility that this will not correct all symptoms was explained. I stressed the importance of low-impact activity, aggressive pain control, avoiding constipation, & not pushing through pain to minimize risk of post-operative chronic pain or injury. Possibility of reherniation especially with smoking, obesity, diabetes, immunosuppression, and other health conditions was discussed. We will work to minimize complications.   An educational handout further explaining the pathology & treatment options was given as well. Questions were answered. The patient expresses understanding & wishes to proceed with surgery.    Edgar Hector, MD, FACS, MASCRS Esophageal, Gastrointestinal & Colorectal Surgery Robotic and Minimally Invasive Surgery  [facility] [id number] [place], [place] [address] [phone] Fax [phone] Main  CONTACT INFORMATION:  Weekday (9AM-5PM): Call [facility] main office at [phone]  Weeknight (5PM-9AM) or Weekend/Holiday:  Check [email or web address] (password " [email or web address]") for General Surgery [facility] coverage  (Please, do not use SecureChat as it is not reliable communication to reach operating surgeons for immediate patient care given surgeries/outpatient duties/clinic/cross-coverage/off post-call which would lead to a delay in care.  Epic staff messaging available for outptient concerns, but may not be answered for 48 hours or more).

## 2022-05-13 NOTE — Anesthesia Postprocedure Evaluation (Signed)
Anesthesia Post Note  Patient: Edgar Gallegos  Procedure(s) Performed: LAPAROSCOPIC VENTRAL WALL HERNIA REPAIR WITH MESH (Abdomen)     Patient location during evaluation: PACU Anesthesia Type: General Level of consciousness: awake and alert Pain management: pain level controlled Vital Signs Assessment: post-procedure vital signs reviewed and stable Respiratory status: spontaneous breathing, nonlabored ventilation and respiratory function stable Cardiovascular status: blood pressure returned to baseline Postop Assessment: no apparent nausea or vomiting Anesthetic complications: no   No notable events documented.  Last Vitals:  Vitals:   05/13/22 1000 05/13/22 1015  BP: (!) 146/94 136/83  Pulse: 88 77  Resp: (!) 21 10  Temp:  36.5 C  SpO2: 97% 93%    Last Pain:  Vitals:   05/13/22 1015  TempSrc:   PainSc: 0-No pain                 Marthenia Rolling

## 2022-05-13 NOTE — Anesthesia Procedure Notes (Signed)
Procedure Name: Intubation Date/Time: 05/13/2022 7:49 AM  Performed by: Garrel Ridgel, CRNAPre-anesthesia Checklist: Patient identified, Emergency Drugs available, Suction available and Patient being monitored Patient Re-evaluated:Patient Re-evaluated prior to induction Oxygen Delivery Method: Circle system utilized Preoxygenation: Pre-oxygenation with 100% oxygen Induction Type: IV induction Ventilation: Mask ventilation without difficulty Laryngoscope Size: Mac and 4 Grade View: Grade I Tube type: Oral Tube size: 7.5 mm Number of attempts: 1 Airway Equipment and Method: Stylet and Oral airway Placement Confirmation: ETT inserted through vocal cords under direct vision, positive ETCO2 and breath sounds checked- equal and bilateral Secured at: 23 cm Tube secured with: Tape Dental Injury: Teeth and Oropharynx as per pre-operative assessment

## 2022-05-13 NOTE — Transfer of Care (Signed)
Immediate Anesthesia Transfer of Care Note  Patient: Edgar Gallegos  Procedure(s) Performed: LAPAROSCOPIC VENTRAL WALL HERNIA REPAIR WITH MESH (Abdomen)  Patient Location: PACU  Anesthesia Type:General  Level of Consciousness: oriented, drowsy and patient cooperative  Airway & Oxygen Therapy: Patient Spontanous Breathing and Patient connected to face mask oxygen  Post-op Assessment: Report given to RN and Post -op Vital signs reviewed and stable  Post vital signs: Reviewed and stable  Last Vitals:  Vitals Value Taken Time  BP 138/79 05/13/22 0938  Temp    Pulse 85 05/13/22 0942  Resp 16 05/13/22 0942  SpO2 99 % 05/13/22 0942  Vitals shown include unvalidated device data.  Last Pain:  Vitals:   05/13/22 0552  TempSrc:   PainSc: 0-No pain         Complications: No notable events documented.

## 2022-05-13 NOTE — Interval H&P Note (Signed)
History and Physical Interval Note:  05/13/2022 7:21 AM  Edgar Gallegos  has presented today for surgery, with the diagnosis of PERIUMBILICAL ABDOMINAL WALL HERNIAE.  The various methods of treatment have been discussed with the patient and family. After consideration of risks, benefits and other options for treatment, the patient has consented to  Procedure(s) with comments: [place] (N/A) - GEN w/ ERAS PATHWAY LOCAL as a surgical intervention.  The patient's history has been reviewed, patient examined, no change in status, stable for surgery.  I have reviewed the patient's chart and labs.  Questions were answered to the patient's satisfaction.    I have re-reviewed the the patient's records, history, medications, and allergies.  I have re-examined the patient.  I again discussed intraoperative plans and goals of post-operative recovery.  The patient agrees to proceed.  Rodrigue Braband  08-Apr-1966 [id number]  Patient Care Team: Patient, No Pcp Per as PCP - General (General Practice) Izora Gala, MD as Consulting Physician (Otolaryngology) Michael Boston, MD as Consulting Physician (General Surgery)  There are no problems to display for this patient.   History reviewed. No pertinent past medical history.  Past Surgical History:  Procedure Laterality Date   NASAL SEPTUM SURGERY  2000   REPAIR / SUTURE TESTICULAR INJURY  1990    Social History   Socioeconomic History   Marital status: Married    Spouse name: Not on file   Number of children: Not on file   Years of education: Not on file   Highest education level: Not on file  Occupational History   Not on file  Tobacco Use   Smoking status: Never   Smokeless tobacco: Never  Vaping Use   Vaping Use: Never used  Substance and Sexual Activity   Alcohol use: Never   Drug use: Never   Sexual activity: Yes  Other Topics Concern   Not on file  Social History Narrative   Not on file   Social  Determinants of Health   Financial Resource Strain: Not on file  Food Insecurity: Not on file  Transportation Needs: Not on file  Physical Activity: Not on file  Stress: Not on file  Social Connections: Not on file  Intimate Partner Violence: Not on file    History reviewed. No pertinent family history.  No medications prior to admission.    Current Facility-Administered Medications  Medication Dose Route Frequency Provider Last Rate Last Admin   bupivacaine liposome (EXPAREL) 1.3 % injection 266 mg  20 mL Infiltration Once Michael Boston, MD       ceFAZolin (ANCEF) IVPB 2g/100 mL premix  2 g Intravenous On Call to OR Michael Boston, MD       Chlorhexidine Gluconate Cloth 2 % PADS 6 each  6 each Topical Once Michael Boston, MD       lactated ringers infusion   Intravenous Continuous Janeece Riggers, MD 10 mL/hr at 05/13/22 [phone] Continued from Pre-op at 05/13/22 0643     No Known Allergies  BP (!) 182/101   Pulse 87   Temp 97.7 F (36.5 C) (Oral)   Resp 16   Ht 6\' 3"  (1.905 m)   Wt 115.2 kg   SpO2 96%   BMI 31.74 kg/m   Labs: No results found for this or any previous visit (from the past 48 hour(s)).  Imaging / Studies: No results found.   Adin Hector, M.D., F.A.C.S. Gastrointestinal and Minimally Invasive Surgery [facility] [phone]  [address] [place], [place] [address] [phone] Main / Paging  05/13/2022 7:22 AM    Adin Hector

## 2022-05-13 NOTE — Discharge Instructions (Signed)
HERNIA REPAIR: POST OP INSTRUCTIONS  ######################################################################  EAT Gradually transition to a high fiber diet with a fiber supplement over the next few weeks after discharge.  Start with a pureed / full liquid diet (see below)  WALK Walk an hour a day.  Control your pain to do that.    CONTROL PAIN Control pain so that you can walk, sleep, tolerate sneezing/coughing, and go up/down stairs.  HAVE A BOWEL MOVEMENT DAILY Keep your bowels regular to avoid problems.  OK to try a laxative to override constipation.  OK to use an antidairrheal to slow down diarrhea.  Call if not better after 2 tries  CALL IF YOU HAVE PROBLEMS/CONCERNS Call if you are still struggling despite following these instructions. Call if you have concerns not answered by these instructions  ######################################################################    DIET: Follow a light bland diet & liquids the first 24 hours after arrival home, such as soup, liquids, starches, etc.  Be sure to drink plenty of fluids.  Quickly advance to a usual solid diet within a few days.  Avoid fast food or heavy meals as your are more likely to get nauseated or have irregular bowels.  A low-fat, high-fiber diet for the rest of your life is ideal.   Take your usually prescribed home medications unless otherwise directed.  PAIN CONTROL: Pain is best controlled by a usual combination of three different methods TOGETHER: Ice/Heat Over the counter pain medication Prescription pain medication Most patients will experience some swelling and bruising around the hernia(s) such as the bellybutton, groins, or old incisions.  Ice packs or heating pads (30-60 minutes up to 6 times a day) will help. Use ice for the first few days to help decrease swelling and bruising, then switch to heat to help relax tight/sore spots and speed recovery.  Some people prefer to use ice alone, heat alone, alternating  between ice & heat.  Experiment to what works for you.  Swelling and bruising can take several weeks to resolve.   It is helpful to take an over-the-counter pain medication regularly for the first few weeks.  Choose one of the following that works best for you: Naproxen (Aleve, etc)  Two 220mg tabs twice a day Ibuprofen (Advil, etc) Three 200mg tabs four times a day (every meal & bedtime) Acetaminophen (Tylenol, etc) 325-650mg four times a day (every meal & bedtime) A  prescription for pain medication should be given to you upon discharge.  Take your pain medication as prescribed.  If you are having problems/concerns with the prescription medicine (does not control pain, nausea, vomiting, rash, itching, etc), please call us [phone] to see if we need to switch you to a different pain medicine that will work better for you and/or control your side effect better. If you need a refill on your pain medication, please contact your pharmacy.  They will contact our office to request authorization. Prescriptions will not be filled after 5 pm or on week-ends.  Avoid getting constipated.  Between the surgery and the pain medications, it is common to experience some constipation.  Increasing fluid intake and taking a fiber supplement (such as Metamucil, Citrucel, FiberCon, MiraLax, etc) 1-2 times a day regularly will usually help prevent this problem from occurring.  A mild laxative (prune juice, Milk of Magnesia, MiraLax, etc) should be taken according to package directions if there are no bowel movements after 48 hours.    Wash / shower every day.  You may shower over the dressings   as they are waterproof.    It is good for closed incisions and even open wounds to be washed every day.  Shower every day.  Short baths are fine.  Wash the incisions and wounds clean with soap & water.    You may leave closed incisions open to air if it is dry.   You may cover the incision with clean gauze & replace it after  your daily shower for comfort.  TEGADERM & STERISTRIPS:  You have clear gauze band-aid dressings over your closed incision(s).  You also have skin tapes called Steristrips on your incisions.  Leave these in place.  You may trim any edges that curl up with clean scissors.  You may remove all dressings & tapes in the shower in a week.    ACTIVITIES as tolerated:   You may resume regular (light) daily activities beginning the next day--such as daily self-care, walking, climbing stairs--gradually increasing activities as tolerated.  Control your pain so that you can walk an hour a day.  If you can walk 30 minutes without difficulty, it is safe to try more intense activity such as jogging, treadmill, bicycling, low-impact aerobics, swimming, etc. Save the most intensive and strenuous activity for last such as sit-ups, heavy lifting, contact sports, etc  Refrain from any heavy lifting or straining until you are off narcotics for pain control.   DO NOT PUSH THROUGH PAIN.  Let pain be your guide: If it hurts to do something, don't do it.  Pain is your body warning you to avoid that activity for another week until the pain goes down. You may drive when you are no longer taking prescription pain medication, you can comfortably wear a seatbelt, and you can safely maneuver your car and apply brakes. You may have sexual intercourse when it is comfortable.   FOLLOW UP in our office Please call [facility] at [phone] to set up an appointment to see your surgeon in the office for a follow-up appointment approximately 2-3 weeks after your surgery. Make sure that you call for this appointment the day you arrive home to insure a convenient appointment time.  9.  If you have disability of FMLA / Family leave forms, please bring the forms to the office for processing.  (do not give to your surgeon).  WHEN TO CALL US [phone]: Poor pain control Reactions / problems with new medications (rash/itching, nausea,  etc)  Fever over 101.5 F (38.5 C) Inability to urinate Nausea and/or vomiting Worsening swelling or bruising Continued bleeding from incision. Increased pain, redness, or drainage from the incision   The clinic staff is available to answer your questions during regular business hours (8:30am-5pm).  Please don't hesitate to call and ask to speak to one of our nurses for clinical concerns.   If you have a medical emergency, go to the nearest emergency room or call 911.  A surgeon from [facility] is always on call at the hospitals in [place]  [facility] [address], [address], [place], [place]  [address] ?  [address] [address], , [place]   [address] MAIN: [phone] ? TOLL FREE: [phone] ? FAX: [phone] [email or web address]    

## 2022-05-14 ENCOUNTER — Encounter (HOSPITAL_COMMUNITY): Payer: Self-pay | Admitting: Surgery
